# Patient Record
Sex: Male | Born: 1995 | Race: White | Hispanic: No | Marital: Single | Smoking: Never smoker
Health system: Southern US, Community
[De-identification: ages and names within clinical notes are randomized; demographics above are authoritative.]

## PROBLEM LIST (undated history)

## (undated) DIAGNOSIS — E119 Type 2 diabetes mellitus without complications: Secondary | ICD-10-CM

---

## 2019-04-10 ENCOUNTER — Encounter (HOSPITAL_COMMUNITY): Payer: Self-pay

## 2019-04-10 ENCOUNTER — Emergency Department (HOSPITAL_COMMUNITY)
Admission: EM | Admit: 2019-04-10 | Discharge: 2019-04-11 | Disposition: A | Discharge status: Home / Self Care | Payer: BLUE CROSS/BLUE SHIELD | Attending: Emergency Medicine | Admitting: Emergency Medicine

## 2019-04-10 ENCOUNTER — Other Ambulatory Visit: Payer: Self-pay

## 2019-04-10 ENCOUNTER — Emergency Department (HOSPITAL_COMMUNITY): Payer: BLUE CROSS/BLUE SHIELD

## 2019-04-10 DIAGNOSIS — Y999 Unspecified external cause status: Secondary | ICD-10-CM | POA: Insufficient documentation

## 2019-04-10 DIAGNOSIS — W2209XA Striking against other stationary object, initial encounter: Secondary | ICD-10-CM | POA: Insufficient documentation

## 2019-04-10 DIAGNOSIS — S62339A Displaced fracture of neck of unspecified metacarpal bone, initial encounter for closed fracture: Secondary | ICD-10-CM

## 2019-04-10 DIAGNOSIS — E119 Type 2 diabetes mellitus without complications: Secondary | ICD-10-CM | POA: Insufficient documentation

## 2019-04-10 DIAGNOSIS — Y929 Unspecified place or not applicable: Secondary | ICD-10-CM | POA: Diagnosis not present

## 2019-04-10 DIAGNOSIS — S62324A Displaced fracture of shaft of fourth metacarpal bone, right hand, initial encounter for closed fracture: Secondary | ICD-10-CM | POA: Diagnosis not present

## 2019-04-10 DIAGNOSIS — S6991XA Unspecified injury of right wrist, hand and finger(s), initial encounter: Secondary | ICD-10-CM | POA: Diagnosis present

## 2019-04-10 DIAGNOSIS — Y9389 Activity, other specified: Secondary | ICD-10-CM | POA: Diagnosis not present

## 2019-04-10 HISTORY — DX: Type 2 diabetes mellitus without complications: E11.9

## 2019-04-10 MED ORDER — IBUPROFEN 400 MG PO TABS
800.0000 mg | ORAL_TABLET | Freq: Once | ORAL | Status: AC
  Administered 2019-04-10: 800 mg via ORAL
  Filled 2019-04-10: qty 2
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
## 2019-04-11 NOTE — ED Notes (Signed)
ED Provider at bedside. 

## 2019-04-11 NOTE — Progress Notes (Signed)
Orthopedic Tech Progress Note Patient Details:  Christopher Monroe 05/12/1996 [id number]  Ortho Devices Type of Ortho Device: Ulna gutter splint Ortho Device/Splint Interventions: Application, Ordered, Adjustment   Post Interventions Patient Tolerated: Well Instructions Provided: Care of device, Adjustment of device   Melony Overly T 04/11/2019, 12:15 AM
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
## 2019-04-11 NOTE — ED Notes (Signed)
Splint applied, CMS intact.

## 2019-04-11 NOTE — ED Notes (Signed)
Ortho tech at bedside 

## 2019-09-27 ENCOUNTER — Other Ambulatory Visit: Payer: Self-pay

## 2019-09-27 DIAGNOSIS — Z20822 Contact with and (suspected) exposure to covid-19: Secondary | ICD-10-CM

## 2019-09-29 LAB — NOVEL CORONAVIRUS, NAA: SARS-CoV-2, NAA: NOT DETECTED

## 2020-01-05 ENCOUNTER — Ambulatory Visit: Payer: BLUE CROSS/BLUE SHIELD | Attending: Internal Medicine

## 2020-01-05 DIAGNOSIS — Z23 Encounter for immunization: Secondary | ICD-10-CM

## 2020-01-05 NOTE — Progress Notes (Signed)
   Covid-19 Vaccination Clinic  Name:  Christopher Monroe    MRN: [id number] DOB: Jun 30, 1996  01/05/2020  Christopher Monroe was observed post Covid-19 immunization for 15 minutes without incident. He was provided with Vaccine Information Sheet and instruction to access the V-Safe system.   Christopher Monroe was instructed to call 911 with any severe reactions post vaccine: Marland Kitchen Difficulty breathing  . Swelling of face and throat  . A fast heartbeat  . A bad rash all over body  . Dizziness and weakness   Immunizations Administered    Name Date Dose VIS Date Route   Pfizer COVID-19 Vaccine 01/05/2020  1:15 PM 0.3 mL 09/29/2019 Intramuscular   Manufacturer: [employer], [employer]   Lot: [id number]   NDC: [address]-1

## 2020-01-18 IMAGING — DX RIGHT HAND - COMPLETE 3+ VIEW
3 series · 3 of 3 positions shown · non-contrast
Comparison: None.

CLINICAL DATA: Right hand pain after punching something.

EXAM:
RIGHT HAND - COMPLETE 3+ VIEW

[hand pa]
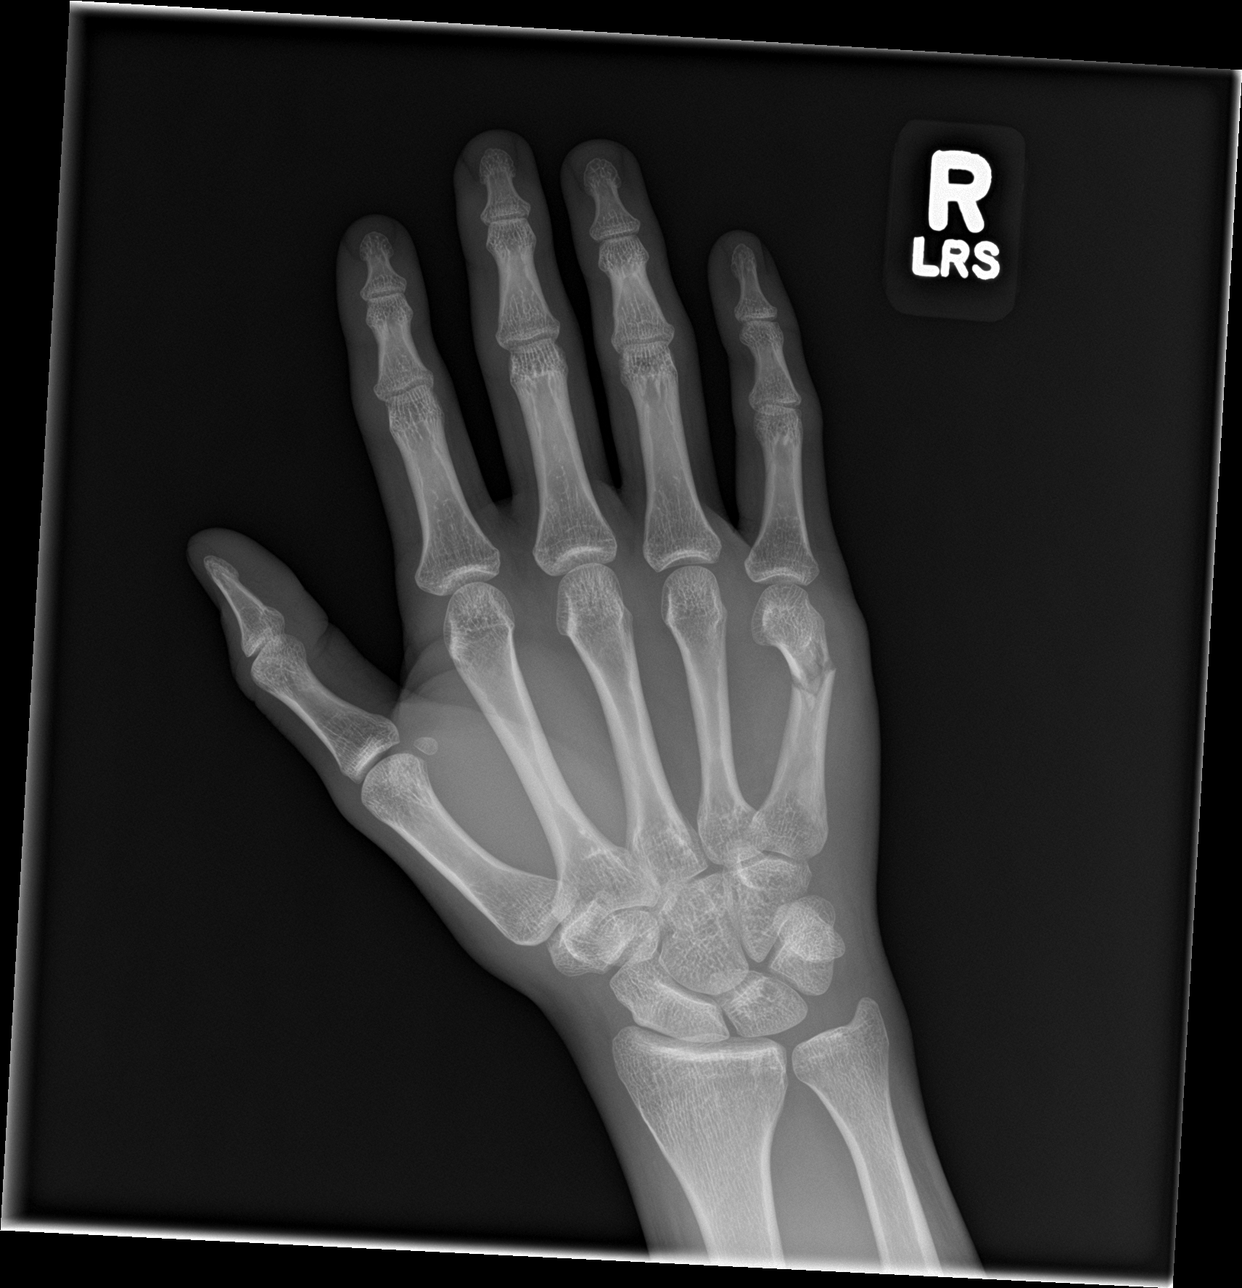

[hand obl]
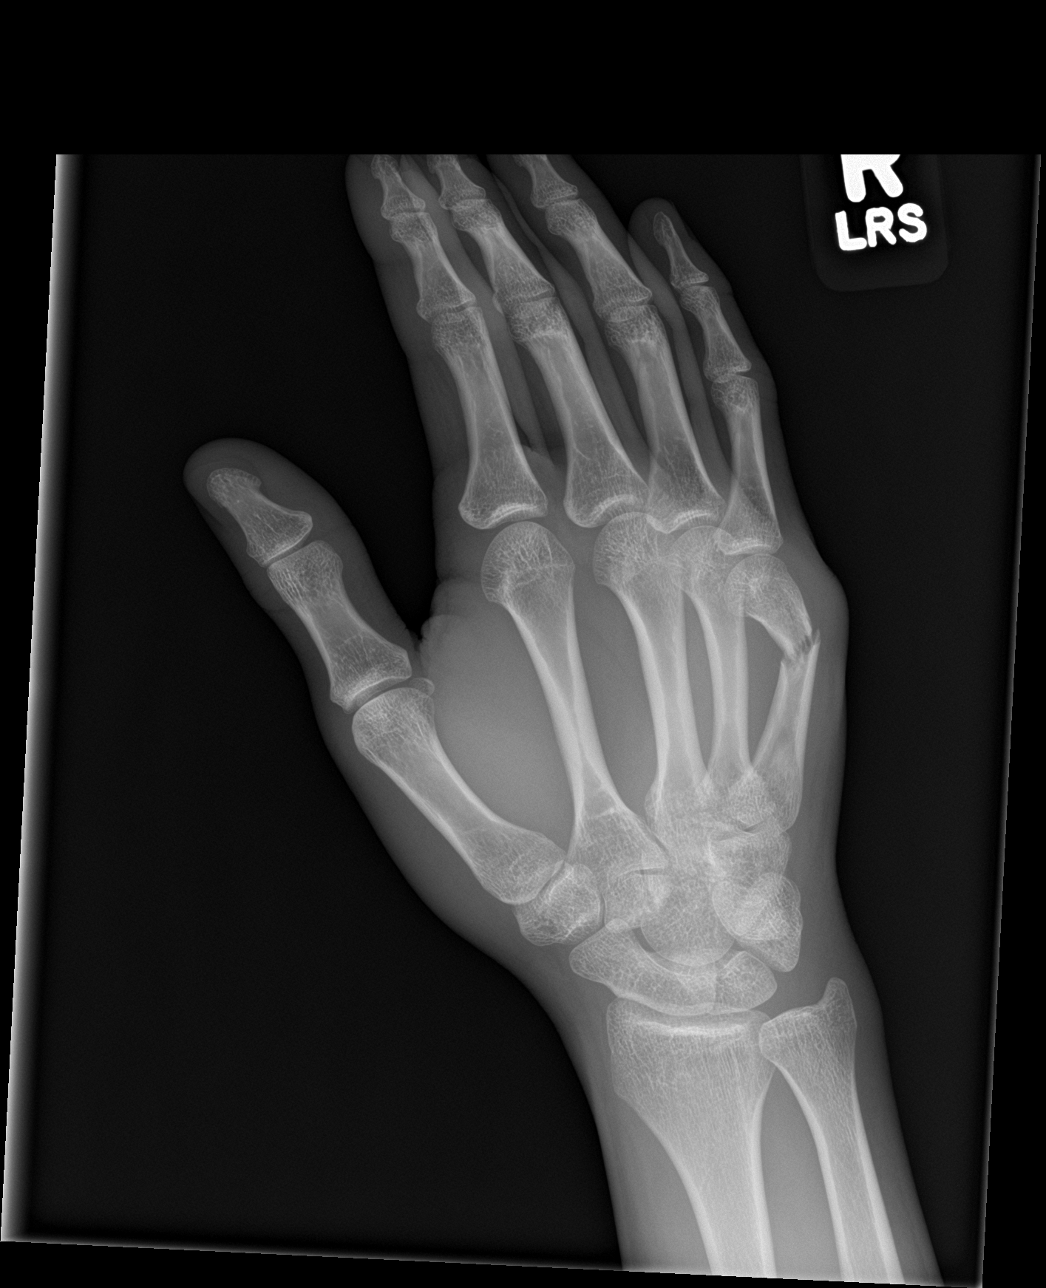

[hand lat]
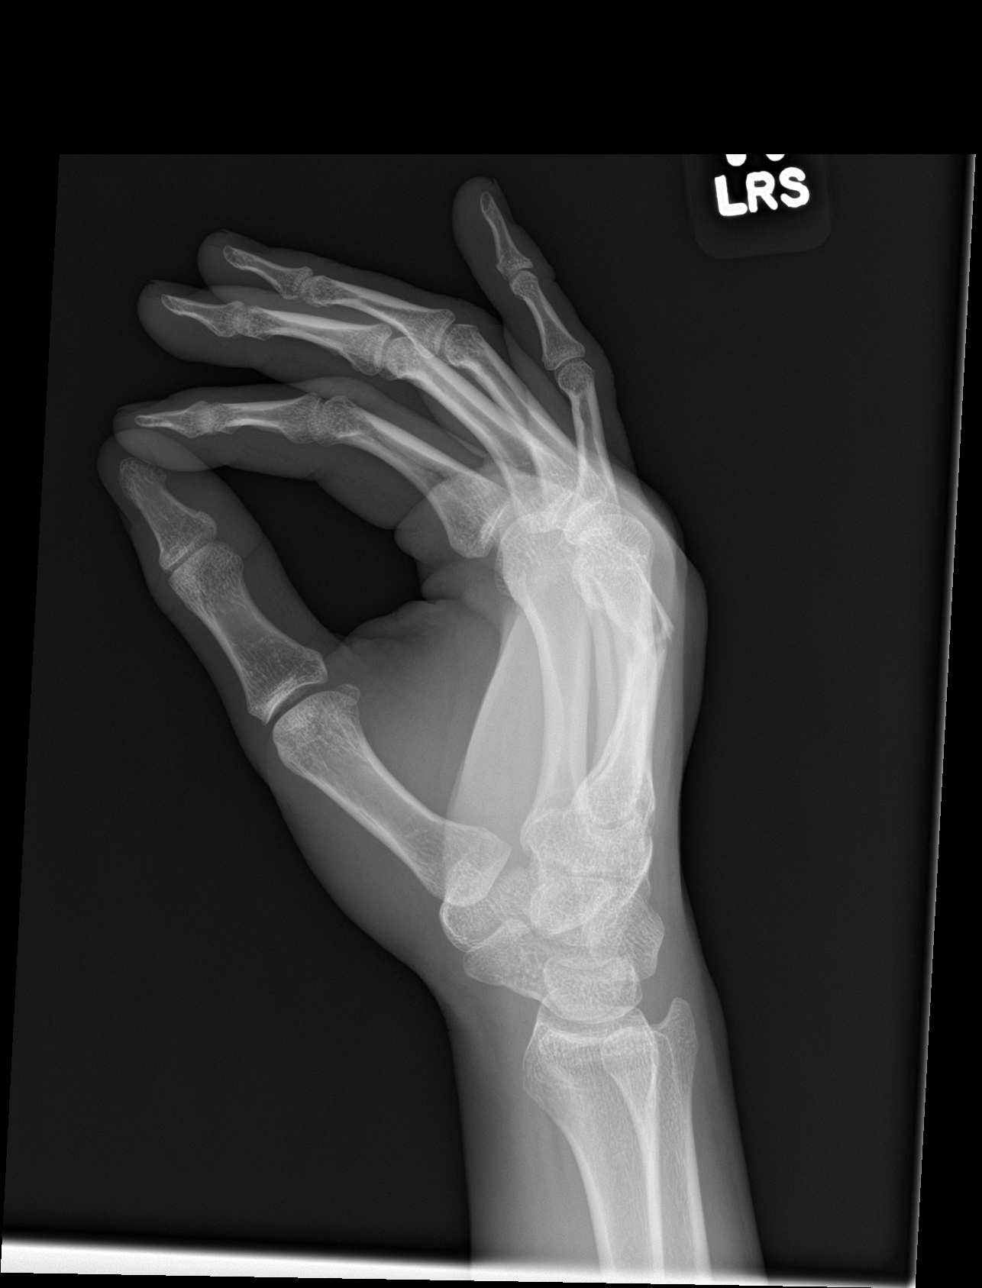

[3 of 3 positions shown; findings below may reference images not displayed]

FINDINGS: Mildly displaced comminuted and angulated distal fifth metacarpal
fracture. Associated soft tissue edema. No intra-articular
extension. No additional fracture of the hand.
IMPRESSION: Mildly displaced comminuted and angulated distal fifth metacarpal
fracture.

## 2020-01-31 ENCOUNTER — Ambulatory Visit: Payer: Medicaid Other | Attending: Internal Medicine

## 2020-01-31 DIAGNOSIS — Z23 Encounter for immunization: Secondary | ICD-10-CM

## 2020-01-31 NOTE — Progress Notes (Signed)
   Covid-19 Vaccination Clinic  Name:  Christopher Monroe    MRN: [id number] DOB: 10/12/1996  01/31/2020  Mr. Christopher Monroe was observed post Covid-19 immunization for 15 minutes without incident. He was provided with Vaccine Information Sheet and instruction to access the V-Safe system.   Mr. Christopher Monroe was instructed to call 911 with any severe reactions post vaccine: Marland Kitchen Difficulty breathing  . Swelling of face and throat  . A fast heartbeat  . A bad rash all over body  . Dizziness and weakness   Immunizations Administered    Name Date Dose VIS Date Route   Pfizer COVID-19 Vaccine 01/31/2020  2:59 PM 0.3 mL 09/29/2019 Intramuscular   Manufacturer: [employer], [employer]   Lot: [id number]   NDC: [address]-8

## 2020-07-05 ENCOUNTER — Other Ambulatory Visit: Payer: Self-pay

## 2020-07-05 ENCOUNTER — Ambulatory Visit (HOSPITAL_COMMUNITY)
Admission: RE | Admit: 2020-07-05 | Discharge: 2020-07-05 | Disposition: A | Discharge status: Home / Self Care | Payer: Medicaid Other | Source: Home / Self Care | Attending: Psychiatry | Admitting: Psychiatry

## 2020-07-05 DIAGNOSIS — F338 Other recurrent depressive disorders: Secondary | ICD-10-CM | POA: Insufficient documentation

## 2020-07-05 DIAGNOSIS — F339 Major depressive disorder, recurrent, unspecified: Secondary | ICD-10-CM | POA: Insufficient documentation

## 2020-07-05 DIAGNOSIS — R45851 Suicidal ideations: Secondary | ICD-10-CM | POA: Insufficient documentation

## 2020-07-05 DIAGNOSIS — Z20822 Contact with and (suspected) exposure to covid-19: Secondary | ICD-10-CM | POA: Insufficient documentation

## 2020-07-05 NOTE — BHH Counselor (Signed)
Clinician spoke to [facility], RN to express pt is coming to [facility] for Continuing Observation. Another assessment is not needed.    Redmond Pulling, MS, [facility], [facility] Triage Specialist [phone]
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
## 2020-07-06 ENCOUNTER — Ambulatory Visit (HOSPITAL_COMMUNITY)
Admission: EM | Admit: 2020-07-06 | Discharge: 2020-07-06 | Disposition: A | Discharge status: Home / Self Care | Payer: Medicaid Other | Attending: Behavioral Health | Admitting: Behavioral Health

## 2020-07-06 ENCOUNTER — Encounter (HOSPITAL_COMMUNITY): Payer: Self-pay | Admitting: Emergency Medicine

## 2020-07-06 ENCOUNTER — Other Ambulatory Visit: Payer: Self-pay

## 2020-07-06 DIAGNOSIS — F332 Major depressive disorder, recurrent severe without psychotic features: Secondary | ICD-10-CM

## 2020-07-06 LAB — HEMOGLOBIN A1C
Hgb A1c MFr Bld: 7.8 % — ABNORMAL HIGH (ref 4.8–5.6)
Mean Plasma Glucose: 177.16 mg/dL

## 2020-07-06 LAB — COMPREHENSIVE METABOLIC PANEL
ALT: 19 U/L (ref 0–44)
AST: 15 U/L (ref 15–41)
Albumin: 4.8 g/dL (ref 3.5–5.0)
Alkaline Phosphatase: 63 U/L (ref 38–126)
Anion gap: 11 (ref 5–15)
BUN: 12 mg/dL (ref 6–20)
CO2: 27 mmol/L (ref 22–32)
Calcium: 10.1 mg/dL (ref 8.9–10.3)
Chloride: 101 mmol/L (ref 98–111)
Creatinine, Ser: 1.01 mg/dL (ref 0.61–1.24)
GFR calc Af Amer: >60 mL/min (ref >60)
GFR calc non Af Amer: >60 mL/min (ref >60)
Glucose, Bld: 249 mg/dL — ABNORMAL HIGH (ref 70–99)
Potassium: 3.8 mmol/L (ref 3.5–5.1)
Sodium: 139 mmol/L (ref 135–145)
Total Bilirubin: 0.6 mg/dL (ref 0.3–1.2)
Total Protein: 7.9 g/dL (ref 6.5–8.1)

## 2020-07-06 LAB — CBC WITH DIFFERENTIAL/PLATELET
Abs Immature Granulocytes: 0.02 10*3/uL (ref 0.00–0.07)
Basophils Absolute: 0 10*3/uL (ref 0.0–0.1)
Basophils Relative: 1 %
Eosinophils Absolute: 0 10*3/uL (ref 0.0–0.5)
Eosinophils Relative: 1 %
HCT: 46.9 % (ref 39.0–52.0)
Hemoglobin: 15.2 g/dL (ref 13.0–17.0)
Immature Granulocytes: 0 %
Lymphocytes Relative: 28 %
Lymphs Abs: 1.7 10*3/uL (ref 0.7–4.0)
MCH: 29.7 pg (ref 26.0–34.0)
MCHC: 32.4 g/dL (ref 30.0–36.0)
MCV: 91.8 fL (ref 80.0–100.0)
Monocytes Absolute: 0.6 10*3/uL (ref 0.1–1.0)
Monocytes Relative: 9 %
Neutro Abs: 3.9 10*3/uL (ref 1.7–7.7)
Neutrophils Relative %: 61 %
Platelets: 299 10*3/uL (ref 150–400)
RBC: 5.11 MIL/uL (ref 4.22–5.81)
RDW: 13.3 % (ref 11.5–15.5)
WBC: 6.3 10*3/uL (ref 4.0–10.5)
nRBC: 0 % (ref 0.0–0.2)

## 2020-07-06 LAB — POCT URINE DRUG SCREEN - MANUAL ENTRY (I-SCREEN)
POC Amphetamine UR: NOT DETECTED
POC Buprenorphine (BUP): NOT DETECTED
POC Cocaine UR: NOT DETECTED
POC Marijuana UR: POSITIVE — AB
POC Methadone UR: NOT DETECTED
POC Methamphetamine UR: NOT DETECTED
POC Morphine: NOT DETECTED
POC Oxazepam (BZO): NOT DETECTED
POC Oxycodone UR: NOT DETECTED
POC Secobarbital (BAR): NOT DETECTED

## 2020-07-06 LAB — LIPID PANEL
Cholesterol: 189 mg/dL (ref 0–200)
HDL: 75 mg/dL (ref >40)
LDL Cholesterol: 95 mg/dL (ref 0–99)
Total CHOL/HDL Ratio: 2.5 RATIO
Triglycerides: 96 mg/dL (ref <150)
VLDL: 19 mg/dL (ref 0–40)

## 2020-07-06 LAB — POC SARS CORONAVIRUS 2 AG -  ED: SARS Coronavirus 2 Ag: NEGATIVE

## 2020-07-06 LAB — GLUCOSE, CAPILLARY
Glucose-Capillary: 397 mg/dL — ABNORMAL HIGH (ref 70–99)
Glucose-Capillary: 414 mg/dL — ABNORMAL HIGH (ref 70–99)
Glucose-Capillary: 449 mg/dL — ABNORMAL HIGH (ref 70–99)

## 2020-07-06 LAB — ETHANOL: Alcohol, Ethyl (B): <10 mg/dL (ref <10)

## 2020-07-06 LAB — MAGNESIUM: Magnesium: 2 mg/dL (ref 1.7–2.4)

## 2020-07-06 LAB — SARS CORONAVIRUS 2 BY RT PCR (HOSPITAL ORDER, PERFORMED IN ~~LOC~~ HOSPITAL LAB): SARS Coronavirus 2: NEGATIVE

## 2020-07-06 LAB — TSH: TSH: 3.175 u[IU]/mL (ref 0.350–4.500)

## 2020-07-06 MED ORDER — ALUM & MAG HYDROXIDE-SIMETH 200-200-20 MG/5ML PO SUSP: 30.0000 mL | ORAL | Status: DC | PRN

## 2020-07-06 MED ORDER — MAGNESIUM HYDROXIDE 400 MG/5ML PO SUSP: 30.0000 mL | Freq: Every day | ORAL | Status: DC | PRN

## 2020-07-06 MED ORDER — PAROXETINE HCL 20 MG PO TABS
40.0000 mg | ORAL_TABLET | Freq: Every day | ORAL | Status: DC
  Administered 2020-07-06: 40 mg via ORAL
  Filled 2020-07-06: qty 2

## 2020-07-06 MED ORDER — MELATONIN 5 MG PO TABS
10.0000 mg | ORAL_TABLET | Freq: Every day | ORAL | Status: DC
  Administered 2020-07-06: 10 mg via ORAL
  Filled 2020-07-06: qty 2

## 2020-07-06 MED ORDER — ACETAMINOPHEN 325 MG PO TABS: 650.0000 mg | ORAL_TABLET | Freq: Four times a day (QID) | ORAL | Status: DC | PRN

## 2020-07-06 NOTE — ED Triage Notes (Signed)
Pt presents with SI, plan to hang self.

## 2020-07-06 NOTE — ED Notes (Signed)
Pt ate salad and sandwich. Checked BS prior to pt eating meal. Pt administered Novolog coverage for elevated BS

## 2020-07-06 NOTE — ED Notes (Signed)
Pt presents with SI, plan to hang self after argument with girlfriend.  Denies SI, HI or AVH at present.  Skin search completed.  Pt pleasant and interactive with staff.  Monitoring for safety.  No distress noted, Insulin pump to rt upper arm noted.

## 2020-07-06 NOTE — ED Notes (Signed)
Pt in no acute distress. Toileting at present. Safety maintained.
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
## 2020-07-06 NOTE — ED Notes (Signed)
Pt is sleeping @this  time on his right side. Will continue to monitor

## 2020-07-06 NOTE — ED Notes (Signed)
Pt sleeping in no acute distress. Safety maintained. 

## 2020-07-06 NOTE — ED Notes (Signed)
Pts blood sugar 397. Pt administered Novolog per cartridge recommendations. Pt ate raisin bran cereal. Tolerated well. Safety maintained.
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
## 2020-07-06 NOTE — Discharge Instructions (Addendum)
Take all medications as prescribed. Keep all follow-up appointments as scheduled.  Do not consume alcohol or use illegal drugs while on prescription medications. Report any adverse effects from your medications to your primary care provider promptly.  In the event of recurrent symptoms or worsening symptoms, call 911, a crisis hotline, or go to the nearest emergency department for evaluation.   

## 2020-07-06 NOTE — ED Notes (Signed)
Pt discharged in no acute distress. Verbalized understanding of all discharge instructions reviewed by RN. Re-emphasized importance of remaining compliant with diabetic diet and checking BS frequently and to report consistant elevated readings to endocrinologist. Pt verbalized agreement. All pt belongings returned to pt intact. Safety maintained. Pt escorted to lobby to girlfriend for transport to home.

## 2020-07-07 LAB — PROLACTIN: Prolactin: 15.3 ng/mL — ABNORMAL HIGH (ref 4.0–15.2)

## 2020-07-08 LAB — GLUCOSE, CAPILLARY: Glucose-Capillary: 252 mg/dL — ABNORMAL HIGH (ref 70–99)

## 2022-04-10 ENCOUNTER — Encounter (HOSPITAL_COMMUNITY): Payer: Self-pay

## 2022-04-10 ENCOUNTER — Other Ambulatory Visit: Payer: Self-pay

## 2022-04-10 ENCOUNTER — Emergency Department (HOSPITAL_COMMUNITY)
Admission: EM | Admit: 2022-04-10 | Discharge: 2022-04-10 | Disposition: A | Discharge status: Home / Self Care | Payer: Medicaid Other | Attending: Emergency Medicine | Admitting: Emergency Medicine

## 2022-04-10 DIAGNOSIS — E119 Type 2 diabetes mellitus without complications: Secondary | ICD-10-CM | POA: Diagnosis not present

## 2022-04-10 DIAGNOSIS — Z794 Long term (current) use of insulin: Secondary | ICD-10-CM | POA: Diagnosis not present

## 2022-04-10 DIAGNOSIS — J36 Peritonsillar abscess: Secondary | ICD-10-CM | POA: Diagnosis not present

## 2022-04-10 DIAGNOSIS — J029 Acute pharyngitis, unspecified: Secondary | ICD-10-CM | POA: Diagnosis present

## 2022-04-10 LAB — CBC WITH DIFFERENTIAL/PLATELET
Abs Immature Granulocytes: 0.13 10*3/uL — ABNORMAL HIGH (ref 0.00–0.07)
Basophils Absolute: 0 10*3/uL (ref 0.0–0.1)
Basophils Relative: 0 %
Eosinophils Absolute: 0 10*3/uL (ref 0.0–0.5)
Eosinophils Relative: 0 %
HCT: 40.9 % (ref 39.0–52.0)
Hemoglobin: 13.4 g/dL (ref 13.0–17.0)
Immature Granulocytes: 1 %
Lymphocytes Relative: 6 %
Lymphs Abs: 1 10*3/uL (ref 0.7–4.0)
MCH: 31 pg (ref 26.0–34.0)
MCHC: 32.8 g/dL (ref 30.0–36.0)
MCV: 94.7 fL (ref 80.0–100.0)
Monocytes Absolute: 1.3 10*3/uL — ABNORMAL HIGH (ref 0.1–1.0)
Monocytes Relative: 8 %
Neutro Abs: 14.3 10*3/uL — ABNORMAL HIGH (ref 1.7–7.7)
Neutrophils Relative %: 85 %
Platelets: 291 10*3/uL (ref 150–400)
RBC: 4.32 MIL/uL (ref 4.22–5.81)
RDW: 13.2 % (ref 11.5–15.5)
WBC: 16.7 10*3/uL — ABNORMAL HIGH (ref 4.0–10.5)
nRBC: 0 % (ref 0.0–0.2)

## 2022-04-10 LAB — COMPREHENSIVE METABOLIC PANEL
ALT: 12 U/L (ref 0–44)
AST: 16 U/L (ref 15–41)
Albumin: 3.8 g/dL (ref 3.5–5.0)
Alkaline Phosphatase: 68 U/L (ref 38–126)
Anion gap: 13 (ref 5–15)
BUN: 11 mg/dL (ref 6–20)
CO2: 24 mmol/L (ref 22–32)
Calcium: 9.6 mg/dL (ref 8.9–10.3)
Chloride: 101 mmol/L (ref 98–111)
Creatinine, Ser: 0.91 mg/dL (ref 0.61–1.24)
GFR, Estimated: >60 mL/min (ref >60)
Glucose, Bld: 200 mg/dL — ABNORMAL HIGH (ref 70–99)
Potassium: 4 mmol/L (ref 3.5–5.1)
Sodium: 138 mmol/L (ref 135–145)
Total Bilirubin: 0.7 mg/dL (ref 0.3–1.2)
Total Protein: 6.9 g/dL (ref 6.5–8.1)

## 2022-04-10 LAB — MONONUCLEOSIS SCREEN: Mono Screen: NEGATIVE

## 2022-04-10 MED ORDER — LIDOCAINE HCL (PF) 1 % IJ SOLN
30.0000 mL | Freq: Once | INTRAMUSCULAR | Status: AC
Start: 2022-04-10 — End: 2022-04-10
  Administered 2022-04-10: 30 mL
  Filled 2022-04-10: qty 30

## 2022-04-10 MED ORDER — CLINDAMYCIN HCL 300 MG PO CAPS: 300.0000 mg | ORAL_CAPSULE | Freq: Three times a day (TID) | ORAL | 0 refills | Status: AC

## 2022-04-10 MED ORDER — HYDROCODONE-ACETAMINOPHEN 5-325 MG PO TABS: 1.0000 | ORAL_TABLET | ORAL | 0 refills | Status: AC | PRN

## 2022-04-10 MED ORDER — SODIUM CHLORIDE 0.9 % IV BOLUS
1000.0000 mL | Freq: Once | INTRAVENOUS | Status: AC
  Administered 2022-04-10: 1000 mL via INTRAVENOUS

## 2022-04-10 MED ORDER — CLINDAMYCIN HCL 150 MG PO CAPS
300.0000 mg | ORAL_CAPSULE | Freq: Once | ORAL | Status: AC
  Administered 2022-04-10: 300 mg via ORAL
  Filled 2022-04-10: qty 2

## 2022-04-10 NOTE — ED Triage Notes (Addendum)
Pt arrived POV from home c/o a sore throat x2 weeks. Pt states it is now causing the left ear to hurt and his jaw. Pt states he has been coughing up a lot of mucus. Pt states he was tested for strep last week and although it was negative they treated him with antibiotics anyway but still he has no relief.

## 2022-08-12 ENCOUNTER — Emergency Department (HOSPITAL_COMMUNITY)
Admission: EM | Admit: 2022-08-12 | Discharge: 2022-08-13 | Discharge status: Against Medical Advice | Payer: Medicaid Other | Attending: Emergency Medicine | Admitting: Emergency Medicine

## 2022-08-12 ENCOUNTER — Encounter (HOSPITAL_COMMUNITY): Payer: Self-pay | Admitting: Emergency Medicine

## 2022-08-12 ENCOUNTER — Emergency Department (HOSPITAL_COMMUNITY): Payer: Medicaid Other

## 2022-08-12 DIAGNOSIS — J029 Acute pharyngitis, unspecified: Secondary | ICD-10-CM | POA: Insufficient documentation

## 2022-08-12 DIAGNOSIS — Z5321 Procedure and treatment not carried out due to patient leaving prior to being seen by health care provider: Secondary | ICD-10-CM | POA: Insufficient documentation

## 2022-08-12 LAB — CBC WITH DIFFERENTIAL/PLATELET
Abs Immature Granulocytes: 0.03 10*3/uL (ref 0.00–0.07)
Basophils Absolute: 0 10*3/uL (ref 0.0–0.1)
Basophils Relative: 0 %
Eosinophils Absolute: 0 10*3/uL (ref 0.0–0.5)
Eosinophils Relative: 0 %
HCT: 41.6 % (ref 39.0–52.0)
Hemoglobin: 14.1 g/dL (ref 13.0–17.0)
Immature Granulocytes: 0 %
Lymphocytes Relative: 10 %
Lymphs Abs: 1 10*3/uL (ref 0.7–4.0)
MCH: 31.1 pg (ref 26.0–34.0)
MCHC: 33.9 g/dL (ref 30.0–36.0)
MCV: 91.6 fL (ref 80.0–100.0)
Monocytes Absolute: 0.8 10*3/uL (ref 0.1–1.0)
Monocytes Relative: 8 %
Neutro Abs: 8.1 10*3/uL — ABNORMAL HIGH (ref 1.7–7.7)
Neutrophils Relative %: 82 %
Platelets: 240 10*3/uL (ref 150–400)
RBC: 4.54 MIL/uL (ref 4.22–5.81)
RDW: 13 % (ref 11.5–15.5)
WBC: 10 10*3/uL (ref 4.0–10.5)
nRBC: 0 % (ref 0.0–0.2)

## 2022-08-12 LAB — BASIC METABOLIC PANEL
Anion gap: 13 (ref 5–15)
BUN: 11 mg/dL (ref 6–20)
CO2: 23 mmol/L (ref 22–32)
Calcium: 9.7 mg/dL (ref 8.9–10.3)
Chloride: 101 mmol/L (ref 98–111)
Creatinine, Ser: 0.91 mg/dL (ref 0.61–1.24)
GFR, Estimated: >60 mL/min (ref >60)
Glucose, Bld: 271 mg/dL — ABNORMAL HIGH (ref 70–99)
Potassium: 4.2 mmol/L (ref 3.5–5.1)
Sodium: 137 mmol/L (ref 135–145)

## 2022-08-12 MED ORDER — IOHEXOL 350 MG/ML SOLN
75.0000 mL | Freq: Once | INTRAVENOUS | Status: AC | PRN
  Administered 2022-08-12: 75 mL via INTRAVENOUS

## 2022-08-12 NOTE — ED Provider Triage Note (Signed)
Emergency Medicine Provider Triage Evaluation Note  Christopher Monroe , a 26 y.o. male  was evaluated in triage.  Pt complains of worsening sore throat and palpable mass over the last 1 to 2 weeks.  Hx of peritonsillar abscess to the back left side throat June/July of this year.  States this feels somewhat similar.  Difficulty swallowing food, only able to swallow his own secretions at this time.  Denies fever, chills, congestion, or neck stiffness.  Has not been able to eat today.  Review of Systems  Positive:  Negative: See above  Physical Exam  BP 125/65 (BP Location: Left Arm)   Pulse 69   Temp 98.1 F (36.7 C) (Oral)   Resp 17   SpO2 100%  Gen:   Awake, no distress   Resp:  Normal effort  MSK:   Moves extremities without difficulty  Other:  Reduced ROM of TMJ.  Excess salivary secretions in the mouth.  Tenderness of the left neck, specifically near the tonsillar area.  Mass of the left tonsillar area barely visible through mouth, due to reduced ROM again.  Airway intact.  Breathing and communicating without difficulty.  Medical Decision Making  Medically screening exam initiated at 2:15 PM.  Appropriate orders placed.  Christopher Monroe was informed that the remainder of the evaluation will be completed by another provider, this initial triage assessment does not replace that evaluation, and the importance of remaining in the ED until their evaluation is complete.     Christopher Rome, PA-C 31/54/00 1417

## 2022-08-12 NOTE — ED Triage Notes (Signed)
Pt here form home with c/o  possible peritonsil abscess to the left side of his throat , hx of same

## 2022-08-13 NOTE — ED Notes (Signed)
CALLED PATIENT X3 NO ANSWER FOR VITALS RECHECK PATIENT HAS LEFT WITH IV IN LEFT HAND .LAST VITALSIGNS @22 :12.last seen
